# Patient Record
Sex: Female | Born: 1950 | Race: Black or African American | Hispanic: No | Marital: Married | Smoking: Former smoker
Health system: Southern US, Community
[De-identification: ages and names within clinical notes are randomized; demographics above are authoritative.]

## PROBLEM LIST (undated history)

## (undated) DIAGNOSIS — R000 Tachycardia, unspecified: Secondary | ICD-10-CM

## (undated) HISTORY — PX: TUBAL LIGATION: SHX77

## (undated) HISTORY — PX: TONSILLECTOMY: SUR1361

## (undated) HISTORY — PX: OTHER SURGICAL HISTORY: SHX169

## (undated) HISTORY — PX: ABDOMINAL HYSTERECTOMY: SHX81

---

## 2012-11-25 ENCOUNTER — Encounter: Payer: Self-pay | Admitting: *Deleted

## 2012-11-25 ENCOUNTER — Emergency Department
Admission: EM | Admit: 2012-11-25 | Discharge: 2012-11-25 | Disposition: A | Discharge status: Home / Self Care | Payer: BC Managed Care – PPO | Source: Home / Self Care | Attending: Family Medicine | Admitting: Family Medicine

## 2012-11-25 DIAGNOSIS — R109 Unspecified abdominal pain: Secondary | ICD-10-CM

## 2012-11-25 DIAGNOSIS — R300 Dysuria: Secondary | ICD-10-CM

## 2012-11-25 HISTORY — DX: Tachycardia, unspecified: R00.0

## 2012-11-25 LAB — POCT URINALYSIS DIP (MANUAL ENTRY)
Protein Ur, POC: NEGATIVE
Spec Grav, UA: >=1.03 (ref 1.005–1.03)
Urobilinogen, UA: 0.2 (ref 0–1)

## 2012-11-25 MED ORDER — NITROFURANTOIN MONOHYD MACRO 100 MG PO CAPS: 100.0000 mg | ORAL_CAPSULE | Freq: Two times a day (BID) | ORAL | Status: DC

## 2012-11-25 NOTE — ED Provider Notes (Signed)
CSN: [id number]     Arrival date & time 11/25/12  1928 History     First MD Initiated Contact with Patient 11/25/12 1950     Chief Complaint  Patient presents with  . bladder pain       HPI Comments: Patient reports a brief episode of frequency and dysuria two days ago that quickly resolved and has not recurred.  Associated with this she has had a vague sense of heaviness in her lower abdomen.  No nausea/vomiting.  No change in bowel movements.  No fevers, chills, and sweats.    She notes that she has been lifting her 48 year-old grandson over the past week.  She is s/p hysterectomy in 2005, and subsequent lysis of adhesions one year ago.  Patient is a 62 y.o. female presenting with dysuria. The history is provided by the patient.  Dysuria Pain quality:  Burning Pain severity:  Mild Onset quality:  Sudden Duration: brief. Progression:  Resolved Chronicity:  New Relieved by:  Nothing Worsened by:  Nothing tried Ineffective treatments:  None tried Urinary symptoms: no discolored urine, no foul-smelling urine, no frequent urination, no hematuria, no hesitancy and no bladder incontinence   Associated symptoms: abdominal pain   Associated symptoms: no fever, no flank pain, no genital lesions, no nausea, no vaginal discharge and no vomiting   Risk factors: no recurrent urinary tract infections     Past Medical History  Diagnosis Date  . Tachycardia    Past Surgical History  Procedure Laterality Date  . Abdominal hysterectomy    . Tonsillectomy    . Tubal ligation     Family History  Problem Relation Age of Onset  . Diabetes Mother   . Hypertension Mother   . Hyperlipidemia Mother   . Diabetes Father   . Hypertension Father   . Hyperlipidemia Father    History  Substance Use Topics  . Smoking status: Former Games developer  . Smokeless tobacco: Never Used  . Alcohol Use: Yes   OB History   Grav Para Term Preterm Abortions TAB SAB Ect Mult Living                 Review of  Systems  Constitutional: Negative for fever.  Gastrointestinal: Positive for abdominal pain. Negative for nausea and vomiting.  Genitourinary: Positive for dysuria. Negative for flank pain and vaginal discharge.  All other systems reviewed and are negative.    Allergies  Review of patient's allergies indicates no known allergies.  Home Medications   Current Outpatient Rx  Name  Route  Sig  Dispense  Refill  . estradiol (VIVELLE-DOT) 0.025 MG/24HR   Transdermal   Place 1 patch onto the skin 2 (two) times a week.         . metoprolol succinate (TOPROL-XL) 100 MG 24 hr tablet   Oral   Take 100 mg by mouth daily. Take with or immediately following a meal.         . pantoprazole (PROTONIX) 40 MG tablet   Oral   Take 40 mg by mouth daily.         . TRIAMTERENE PO   Oral   Take by mouth.         . nitrofurantoin, macrocrystal-monohydrate, (MACROBID) 100 MG capsule   Oral   Take 1 capsule (100 mg total) by mouth 2 (two) times daily. (Rx void after 11/24/13)   10 capsule   0    BP 127/83  Pulse 81  Temp(Src) 97.4  F (36.3 C) (Oral)  Resp 16  Ht 5' 1.5" (1.562 m)  Wt 159 lb 12.8 oz (72.485 kg)  BMI 29.71 kg/m2  SpO2 98% Physical Exam Nursing notes and Vital Signs reviewed. Appearance:  Patient appears healthy, stated age, and in no acute distress Eyes:  Pupils are equal, round, and reactive to light and accomodation.  Extraocular movement is intact.  Conjunctivae are not inflamed   Mouth/Pharynx:  Normal Neck:  Supple.  No adenopathy Lungs:  Clear to auscultation.  Breath sounds are equal.  Heart:  Regular rate and rhythm without murmurs, rubs, or gallops.  Abdomen:   Well-healed midline surgical scar beneath umbilicus, and transverse scar over suprapubic area without tenderness to palpation or evidence of hernia.  Abdomen nontender without masses or hepatosplenomegaly.  Bowel sounds are present.  No CVA or flank tenderness.  Extremities:  No edema.  No calf  tenderness Skin:  No rash present.    ED Course   Procedures  none  Labs Reviewed  URINE CULTURE pending  POCT URINALYSIS DIP (MANUAL ENTRY) Negative      1. Dysuria.       MDM  Note that patient had mild urinary symptoms for only one day, resolved.  Her exam is benign and her abdominal pain may simply be musculoskeletal from increased lifting.  She is at risk for recurrent bowel obstruction, having had lysis of adhesions one year ago. Urine culture pending.  Await culture results Followup with PCP if not improving. If symptoms become significantly worse during the night or over the weekend, proceed to the local emergency room.   Lattie Haw, MD 11/26/12 [id number]

## 2012-11-25 NOTE — ED Notes (Signed)
Christine Logan c/o bladder pain and dysuria x 2 days.

## 2012-11-27 ENCOUNTER — Telehealth: Payer: Self-pay | Admitting: *Deleted

## 2012-11-27 LAB — URINE CULTURE: Colony Count: 45000

## 2012-11-28 ENCOUNTER — Telehealth: Payer: Self-pay | Admitting: Emergency Medicine

## 2016-08-13 ENCOUNTER — Other Ambulatory Visit: Payer: Self-pay

## 2016-08-13 ENCOUNTER — Emergency Department (INDEPENDENT_AMBULATORY_CARE_PROVIDER_SITE_OTHER): Payer: BC Managed Care – PPO

## 2016-08-13 ENCOUNTER — Emergency Department
Admission: EM | Admit: 2016-08-13 | Discharge: 2016-08-13 | Disposition: A | Discharge status: Home / Self Care | Payer: BC Managed Care – PPO | Source: Home / Self Care | Attending: Family Medicine | Admitting: Family Medicine

## 2016-08-13 ENCOUNTER — Encounter: Payer: Self-pay | Admitting: Emergency Medicine

## 2016-08-13 DIAGNOSIS — J9811 Atelectasis: Secondary | ICD-10-CM | POA: Diagnosis not present

## 2016-08-13 DIAGNOSIS — I700 Atherosclerosis of aorta: Secondary | ICD-10-CM

## 2016-08-13 DIAGNOSIS — J90 Pleural effusion, not elsewhere classified: Secondary | ICD-10-CM

## 2016-08-13 DIAGNOSIS — J181 Lobar pneumonia, unspecified organism: Secondary | ICD-10-CM

## 2016-08-13 DIAGNOSIS — R0789 Other chest pain: Secondary | ICD-10-CM

## 2016-08-13 DIAGNOSIS — J189 Pneumonia, unspecified organism: Secondary | ICD-10-CM

## 2016-08-13 MED ORDER — CYCLOBENZAPRINE HCL 5 MG PO TABS: 5.0000 mg | ORAL_TABLET | Freq: Two times a day (BID) | ORAL | 0 refills | Status: DC | PRN

## 2016-08-13 MED ORDER — AZITHROMYCIN 250 MG PO TABS: 250.0000 mg | ORAL_TABLET | Freq: Every day | ORAL | 0 refills | Status: DC

## 2016-08-13 NOTE — Discharge Instructions (Signed)
°  Please take antibiotics as prescribed and be sure to complete entire course even if you start to feel better to ensure infection does not come back.  Flexeril is a muscle relaxer and may cause drowsiness. Do not drink alcohol, drive, or operate heavy machinery while taking.

## 2016-08-13 NOTE — ED Triage Notes (Signed)
Reports having pain under left breast off and on for 4 days; here visiting from out of town. History of heart arrhythmia and cardioversions.

## 2016-08-13 NOTE — ED Provider Notes (Signed)
CSN: [id number]     Arrival date & time 08/13/16  1856 History   First MD Initiated Contact with Patient 08/13/16 1916     Chief Complaint  Patient presents with  . Chest Pain    left    (Consider location/radiation/quality/duration/timing/severity/associated sxs/prior Treatment) HPI  Christine Logan is a 66 y.o. female presenting to UC with c/o pain under Left breast that has been constant but waxing and waning in severity for about 4 days.  Pt is visiting from out of town.  Hx of a heart arrhythmia and has needed cardioversions in the past but has not needed to see her cardiologist in a few years as she has been doing well.  Denies SOB but does have mild difficulty breathing when she feels a "catch" in her chest with certain movements.  Pt's husband thinks she pulled a muscle but pt is unsure as the pain seems to move to different places on Left side of chest.  She does note she has picked up her 37 year old grandson from time to time and may have caused the pain by lifting him.  Denies abdominal pain, n/v/d. Denies leg swelling or pain. Denies palpitations.  Eating does not seem to make symptoms better or worse.    Past Medical History:  Diagnosis Date  . Tachycardia    Past Surgical History:  Procedure Laterality Date  . ABDOMINAL HYSTERECTOMY    . cardioversions    . TONSILLECTOMY    . TUBAL LIGATION     Family History  Problem Relation Age of Onset  . Diabetes Mother   . Hypertension Mother   . Hyperlipidemia Mother   . Diabetes Father   . Hypertension Father   . Hyperlipidemia Father    Social History  Substance Use Topics  . Smoking status: Former Games developer  . Smokeless tobacco: Never Used  . Alcohol use Yes   OB History    No data available     Review of Systems  Constitutional: Negative for chills and fever.  HENT: Negative for congestion, ear pain, sore throat, trouble swallowing and voice change.   Respiratory: Positive for shortness of breath (intermittent when  pain "catches"). Negative for cough.   Cardiovascular: Positive for chest pain ( left side). Negative for palpitations.  Gastrointestinal: Negative for abdominal pain, diarrhea, nausea and vomiting.  Musculoskeletal: Negative for arthralgias, back pain and myalgias.  Skin: Negative for rash.    Allergies  Patient has no known allergies.  Home Medications   Prior to Admission medications   Medication Sig Start Date End Date Taking? Authorizing Provider  aspirin 81 MG chewable tablet Chew by mouth daily.   Yes Historical Provider, MD  azithromycin (ZITHROMAX) 250 MG tablet Take 1 tablet (250 mg total) by mouth daily. Take first 2 tablets together, then 1 every day until finished. 08/13/16   Junius Finner, PA-C  cyclobenzaprine (FLEXERIL) 5 MG tablet Take 1-2 tablets (5-10 mg total) by mouth 2 (two) times daily as needed for muscle spasms. 08/13/16   Junius Finner, PA-C  estradiol (VIVELLE-DOT) 0.025 MG/24HR Place 1 patch onto the skin 2 (two) times a week.    Historical Provider, MD  metoprolol succinate (TOPROL-XL) 100 MG 24 hr tablet Take 100 mg by mouth daily. Take with or immediately following a meal.    Historical Provider, MD  nitrofurantoin, macrocrystal-monohydrate, (MACROBID) 100 MG capsule Take 1 capsule (100 mg total) by mouth 2 (two) times daily. (Rx void after 11/24/13) 11/25/12   Tera Mater  Beese, MD  pantoprazole (PROTONIX) 40 MG tablet Take 40 mg by mouth daily.    Historical Provider, MD  TRIAMTERENE PO Take by mouth.    Historical Provider, MD   Meds Ordered and Administered this Visit  Medications - No data to display  BP (!) 142/80 (BP Location: Left Arm)   Pulse 82   Temp 97.5 F (36.4 C) (Oral)   Resp 16   Ht  (1.575 m)   Wt 156 lb (70.8 kg)   SpO2 100%   BMI 28.53 kg/m  No data found.   Physical Exam  Constitutional: She is oriented to person, place, and time. She appears well-developed and well-nourished. No distress.  Pt sitting on exam chair, NAD.    HENT:  Head: Normocephalic and atraumatic.  Eyes: EOM are normal.  Neck: Normal range of motion. Neck supple.  Cardiovascular: Normal rate and regular rhythm.   Pulmonary/Chest: Effort normal and breath sounds normal. No respiratory distress. She has no wheezes. She has no rales. She exhibits tenderness ( minimal under left breast).  Abdominal: She exhibits no distension and no mass. There is no tenderness. There is no rebound and no guarding.  Musculoskeletal: Normal range of motion.  Neurological: She is alert and oriented to person, place, and time.  Skin: Skin is warm and dry. She is not diaphoretic.  Psychiatric: She has a normal mood and affect. Her behavior is normal.  Nursing note and vitals reviewed.   Urgent Care Course     Procedures (including critical care time)  Labs Review Labs Reviewed - No data to display  Imaging Review Dg Chest 2 View  Result Date: 08/13/2016 CLINICAL DATA:  Left-sided chest pain beneath the left breast. Pain with inspiration. EXAM: CHEST  2 VIEW COMPARISON:  None. FINDINGS: The heart size and mediastinal contours are within normal limits. Aortic atherosclerosis is identified at the arch. Subtle lingular opacities are noted with atelectasis at the left lung base. Trace left pleural effusion. The visualized skeletal structures are unremarkable. IMPRESSION: Findings suggest a subtle lingular pneumonia with trace left pleural effusion. Aortic atherosclerosis. Electronically Signed   By: Tollie Eth M.D.   On: 08/13/2016 19:57    Date/Time:08/13/16    19:20:45  Ventricular Rate: 83 PR Interval: 130 QRS Duration: 76 QT Interval: 368 QTC Calculation: 432 P-R-T axes: 70  32  38 Text Interpretation: Normal sinus rhythm, possible Left artrial enlargement. Borderline ECG No prior to compare.     MDM   1. Left-sided chest wall pain   2. Community acquired pneumonia of left lower lobe of lung (HCC)     Pt c/o 4 days of Left side chest  pain that is occasionally sharp in nature, worse with certain movements.  Minimal tenderness under Left breast. No abdominal tenderness or GI symptoms.   Vitals: unremarkable including O2 Sat of 100%  EKG: unremarkable   CXR: subtle Left lingular pneumonia   Pain likely due to muscle strain in chest wall vs pleurisy and early pneumonia. Rx: Azithromycin and Flexeril Encouraged alternating cool and warm compresses for comfort.  Pt from out of town as this is her 2nd residence. She does not have a PCP here.  May return to UC if needed.   Discussed symptoms that warrant emergent care in the ED.     Junius Finner, PA-C 08/13/16 2009

## 2016-08-13 NOTE — ED Triage Notes (Signed)
Summoned Junius Finner to triage room after getting patient history.

## 2019-02-04 IMAGING — DX DG CHEST 2V
2 series · 2 of 2 positions shown · non-contrast
Comparison: None.

CLINICAL DATA: Left-sided chest pain beneath the left breast. Pain
with inspiration.

EXAM:
CHEST  2 VIEW

[chest pa]
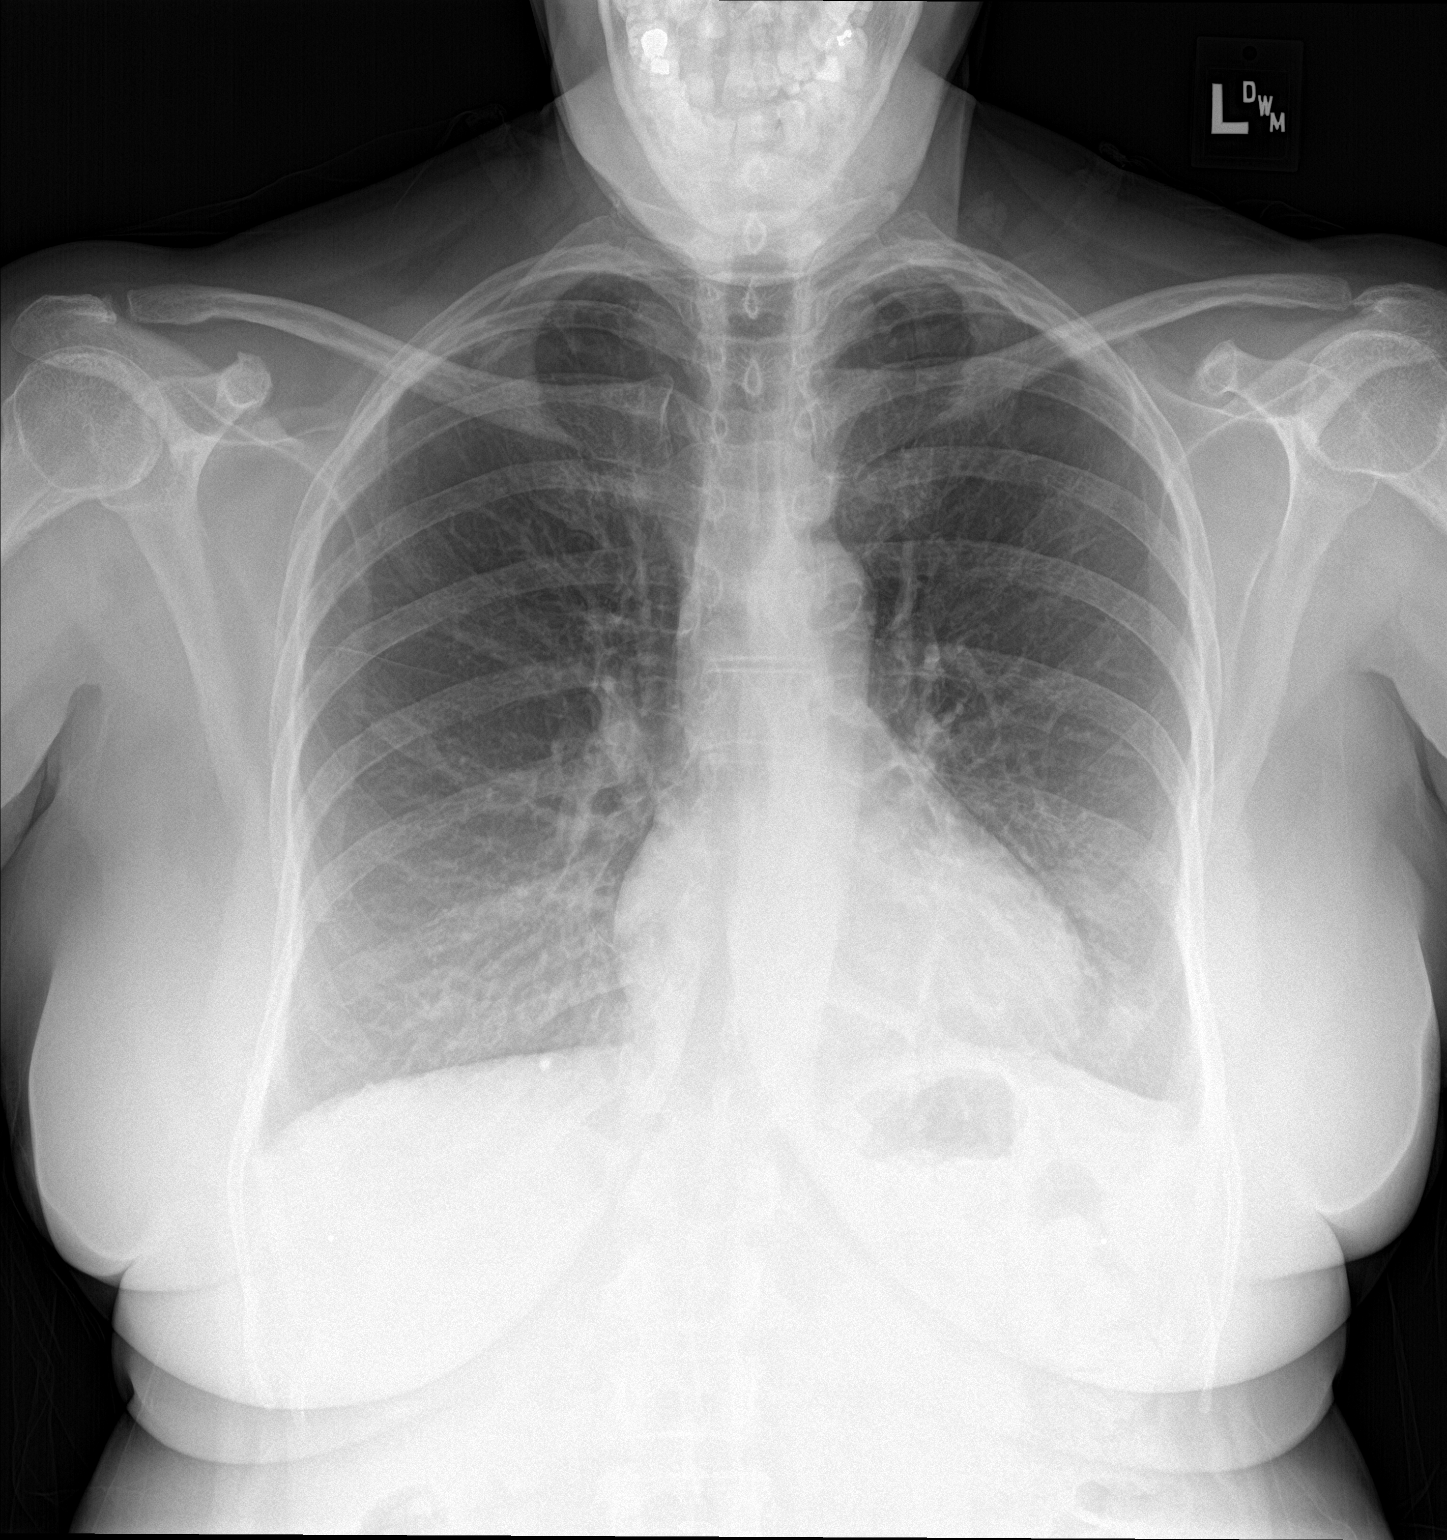

[chest lat]
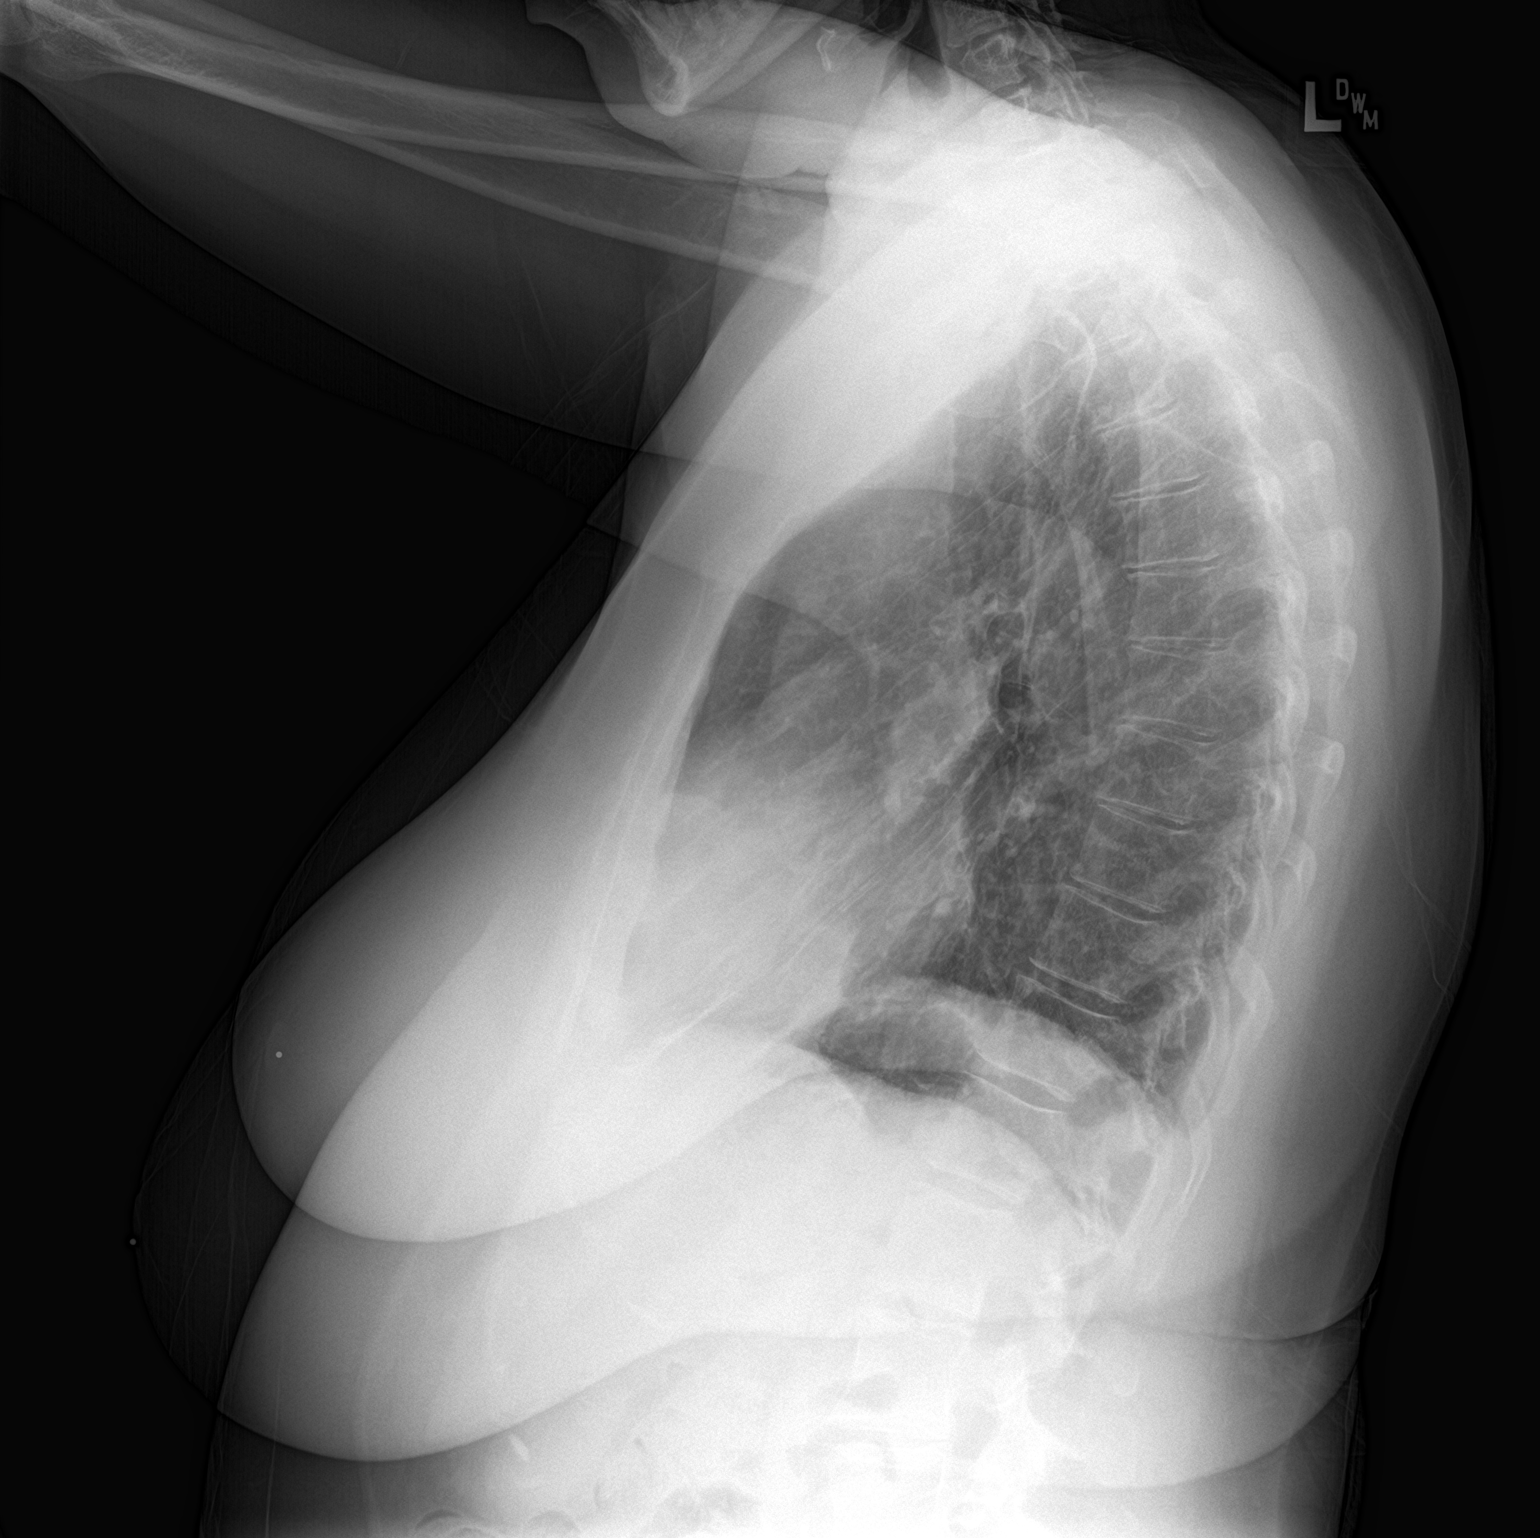

[2 of 2 positions shown; findings below may reference images not displayed]

FINDINGS: The heart size and mediastinal contours are within normal limits.
Aortic atherosclerosis is identified at the arch. Subtle lingular
opacities are noted with atelectasis at the left lung base. Trace
left pleural effusion. The visualized skeletal structures are
unremarkable.
IMPRESSION: Findings suggest a subtle lingular pneumonia with trace left pleural
effusion. Aortic atherosclerosis.

## 2020-05-28 ENCOUNTER — Other Ambulatory Visit: Payer: Self-pay

## 2020-05-28 ENCOUNTER — Emergency Department (INDEPENDENT_AMBULATORY_CARE_PROVIDER_SITE_OTHER)
Admission: EM | Admit: 2020-05-28 | Discharge: 2020-05-28 | Disposition: A | Discharge status: Home / Self Care | Payer: Medicare PPO | Source: Home / Self Care

## 2020-05-28 DIAGNOSIS — R519 Headache, unspecified: Secondary | ICD-10-CM

## 2020-05-28 DIAGNOSIS — J069 Acute upper respiratory infection, unspecified: Secondary | ICD-10-CM

## 2020-05-28 DIAGNOSIS — Z20822 Contact with and (suspected) exposure to covid-19: Secondary | ICD-10-CM | POA: Diagnosis not present

## 2020-05-28 MED ORDER — BENZONATATE 100 MG PO CAPS: 100.0000 mg | ORAL_CAPSULE | Freq: Three times a day (TID) | ORAL | 0 refills | Status: DC

## 2020-05-28 MED ORDER — FLUTICASONE PROPIONATE 50 MCG/ACT NA SUSP: 2.0000 | Freq: Every day | NASAL | 0 refills | Status: AC

## 2020-05-28 NOTE — ED Provider Notes (Signed)
[facility]   [id number] 05/28/20 Arrival Time: 1317   CC: COVID symptoms  SUBJECTIVE: History from: patient.  Christine Logan is a 70 y.o. female who presents with cough, headache x 5 days. Reports that her grandson has been sick. Denies sick exposure to COVID, flu or strep. Denies recent travel. Has negative history of Covid. Has completed Covid vaccines. Has not taken OTC medications for this. There are no aggravating or alleviating factors. Denies fever, chills, fatigue, sinus pain, SOB, wheezing, chest pain, nausea, changes in bowel or bladder habits.    ROS: As per HPI.  All other pertinent ROS negative.     Past Medical History:  Diagnosis Date  . Tachycardia    Past Surgical History:  Procedure Laterality Date  . ABDOMINAL HYSTERECTOMY    . cardioversions    . TONSILLECTOMY    . TUBAL LIGATION     No Known Allergies No current facility-administered medications on file prior to encounter.   Current Outpatient Medications on File Prior to Encounter  Medication Sig Dispense Refill  . atorvastatin (LIPITOR) 20 MG tablet Take 20 mg by mouth daily.    . metoprolol succinate (TOPROL-XL) 100 MG 24 hr tablet Take 100 mg by mouth daily. Take with or immediately following a meal.    . venlafaxine XR (EFFEXOR-XR) 37.5 MG 24 hr capsule Take 37.5 mg by mouth daily with breakfast.    . aspirin 81 MG chewable tablet Chew by mouth daily.    Marland Kitchen azithromycin (ZITHROMAX) 250 MG tablet Take 1 tablet (250 mg total) by mouth daily. Take first 2 tablets together, then 1 every day until finished. 6 tablet 0  . cyclobenzaprine (FLEXERIL) 5 MG tablet Take 1-2 tablets (5-10 mg total) by mouth 2 (two) times daily as needed for muscle spasms. 20 tablet 0  . estradiol (VIVELLE-DOT) 0.025 MG/24HR Place 1 patch onto the skin 2 (two) times a week.    . nitrofurantoin, macrocrystal-monohydrate, (MACROBID) 100 MG capsule Take 1 capsule (100 mg total) by mouth 2 (two) times daily. (Rx void after  11/24/13) 10 capsule 0  . pantoprazole (PROTONIX) 40 MG tablet Take 40 mg by mouth daily.    . TRIAMTERENE PO Take by mouth.     Social History   Socioeconomic History  . Marital status: Married    Spouse name: Not on file  . Number of children: Not on file  . Years of education: Not on file  . Highest education level: Not on file  Occupational History  . Not on file  Tobacco Use  . Smoking status: Former Games developer  . Smokeless tobacco: Never Used  Substance and Sexual Activity  . Alcohol use: Yes    Comment: occ  . Drug use: No  . Sexual activity: Not on file  Other Topics Concern  . Not on file  Social History Narrative  . Not on file   Social Determinants of Health   Financial Resource Strain: Not on file  Food Insecurity: Not on file  Transportation Needs: Not on file  Physical Activity: Not on file  Stress: Not on file  Social Connections: Not on file  Intimate Partner Violence: Not on file   Family History  Problem Relation Age of Onset  . Diabetes Mother   . Hypertension Mother   . Hyperlipidemia Mother   . Diabetes Father   . Hypertension Father   . Hyperlipidemia Father   . Cancer Father     OBJECTIVE:  Vitals:   05/28/20 1334  BP: (!) 180/86  Pulse: 73  Resp: 16  Temp: 98.6 F (37 C)  TempSrc: Oral  SpO2: 97%     General appearance: alert; appears fatigued, but nontoxic; speaking in full sentences and tolerating own secretions HEENT: NCAT; Ears: EACs clear, TMs pearly gray; Eyes: PERRL.  EOM grossly intact. Sinuses: nontender; Nose: nares patent with clear rhinorrhea, Throat: oropharynx erythematous, cobblestoning present, tonsils non erythematous or enlarged, uvula midline  Neck: supple without LAD Lungs: unlabored respirations, symmetrical air entry; cough: absent; no respiratory distress; CTAB Heart: regular rate and rhythm.  Radial pulses 2+ symmetrical bilaterally Skin: warm and dry Psychological: alert and cooperative; normal mood and  affect  LABS:  No results found for this or any previous visit (from the past 24 hour(s)).   ASSESSMENT & PLAN:  1. Viral URI with cough   2. Encounter for laboratory testing for COVID-19 virus   3. Nonintractable headache, unspecified chronicity pattern, unspecified headache type     Meds ordered this encounter  Medications  . fluticasone (FLONASE) 50 MCG/ACT nasal spray    Sig: Place 2 sprays into both nostrils daily.    Dispense:  9.9 mL    Refill:  0    Order Specific Question:   Supervising Provider    Answer:   Merrilee Jansky [id number]  . benzonatate (TESSALON) 100 MG capsule    Sig: Take 1 capsule (100 mg total) by mouth every 8 (eight) hours.    Dispense:  21 capsule    Refill:  0    Order Specific Question:   Supervising Provider    Answer:   Merrilee Jansky [[id number]]   Prescribed flonase Prescribed benzonatate Continue supportive care at home COVID and flu testing ordered.  It will take between 2-3 days for test results. Someone will contact you regarding abnormal results.   Patient should remain in quarantine until they have received Covid results.  If negative you may resume normal activities (go back to work/school) while practicing hand hygiene, social distance, and mask wearing.  If positive, patient should remain in quarantine for at least 5 days from symptom onset AND greater than 72 hours after symptoms resolution (absence of fever without the use of fever-reducing medication and improvement in respiratory symptoms), whichever is longer Get plenty of rest and push fluids Use OTC zyrtec for nasal congestion, runny nose, and/or sore throat Use OTC flonase for nasal congestion and runny nose Use medications daily for symptom relief Use OTC medications like ibuprofen or tylenol as needed fever or pain Call or go to the ED if you have any new or worsening symptoms such as fever, worsening cough, shortness of breath, chest tightness, chest pain, turning blue,  changes in mental status.  Reviewed expectations re: course of current medical issues. Questions answered. Outlined signs and symptoms indicating need for more acute intervention. Patient verbalized understanding. After Visit Summary given.         Moshe Cipro, NP 05/28/20 1347

## 2020-05-28 NOTE — ED Triage Notes (Signed)
Patient presents to Urgent Care with complaints of cough, headache since about 5 days ago. Patient reports she thinks she might have a sinus infection, may have gotten it from her grandsom.

## 2020-05-28 NOTE — Discharge Instructions (Addendum)
I have sent in tessalon perles for you to use one capsule every 8 hours as needed for cough.  I have sent in flonase for you to use 2 sprays per nostril daily as needed for nasal congestion.  Your COVID test is pending.  You should self quarantine until the test result is back.    Take Tylenol or ibuprofen as needed for fever or discomfort.  Rest and keep yourself hydrated.    Follow-up with your primary care provider if your symptoms are not improving.

## 2020-05-30 LAB — SARS-COV-2 RNA,(COVID-19) QUALITATIVE NAAT: SARS CoV2 RNA: NOT DETECTED

## 2020-11-09 ENCOUNTER — Emergency Department
Admission: EM | Admit: 2020-11-09 | Discharge: 2020-11-09 | Disposition: A | Discharge status: Home / Self Care | Payer: Medicare PPO | Source: Home / Self Care

## 2020-11-09 ENCOUNTER — Encounter: Payer: Self-pay | Admitting: Emergency Medicine

## 2020-11-09 ENCOUNTER — Other Ambulatory Visit: Payer: Self-pay

## 2020-11-09 DIAGNOSIS — R300 Dysuria: Secondary | ICD-10-CM

## 2020-11-09 DIAGNOSIS — N3090 Cystitis, unspecified without hematuria: Secondary | ICD-10-CM | POA: Diagnosis not present

## 2020-11-09 LAB — POCT URINALYSIS DIP (MANUAL ENTRY)
Bilirubin, UA: NEGATIVE
Glucose, UA: NEGATIVE mg/dL
Ketones, POC UA: NEGATIVE mg/dL
Nitrite, UA: NEGATIVE
Protein Ur, POC: 30 mg/dL — AB
Spec Grav, UA: >=1.03 — AB (ref 1.010–1.025)
Urobilinogen, UA: 1 E.U./dL
pH, UA: 6 (ref 5.0–8.0)

## 2020-11-09 MED ORDER — CEPHALEXIN 500 MG PO CAPS: 500.0000 mg | ORAL_CAPSULE | Freq: Four times a day (QID) | ORAL | 0 refills | Status: AC

## 2020-11-09 NOTE — ED Triage Notes (Signed)
Urgency w/ bladder pressure since Thursday night  No OTC meds Denies hx of kidney stones

## 2020-11-09 NOTE — Discharge Instructions (Addendum)
Take Keflex 4 times daily for the next seven days.

## 2020-11-09 NOTE — ED Provider Notes (Signed)
[facility]  ____________________________________________  Time seen: Approximately 3:14 PM  I have reviewed the triage vital signs and the nursing notes.   HISTORY  Chief Complaint Dysuria   Historian Patient     HPI Christine Logan is a 70 y.o. female presents to the urgent care with dysuria, hematuria and increased urinary frequency as well as suprapubic pain for the past 2 days.  Patient denies flank pain, nausea, vomiting or other abdominal discomfort.  Denies a history of pyelonephritis or nephrolithiasis.  States that its been at least 15 years since she had a UTI.  No new sexual partners.   Past Medical History:  Diagnosis Date   Tachycardia      Immunizations up to date:  Yes.     Past Medical History:  Diagnosis Date   Tachycardia     There are no problems to display for this patient.   Past Surgical History:  Procedure Laterality Date   ABDOMINAL HYSTERECTOMY     cardioversions     TONSILLECTOMY     TUBAL LIGATION      Prior to Admission medications   Medication Sig Start Date End Date Taking? Authorizing Provider  cephALEXin (KEFLEX) 500 MG capsule Take 1 capsule (500 mg total) by mouth 4 (four) times daily for 7 days. 11/09/20 11/16/20 Yes Pia Mau M, PA-C  aspirin 81 MG chewable tablet Chew by mouth daily.    [provider]  atorvastatin (LIPITOR) 20 MG tablet Take 20 mg by mouth daily.    [provider]  azithromycin (ZITHROMAX) 250 MG tablet Take 1 tablet (250 mg total) by mouth daily. Take first 2 tablets together, then 1 every day until finished. Patient not taking: Reported on 11/09/2020 08/13/16   Lurene Shadow, PA-C  benzonatate (TESSALON) 100 MG capsule Take 1 capsule (100 mg total) by mouth every 8 (eight) hours. Patient not taking: Reported on 11/09/2020 05/28/20   Moshe Cipro, NP  cyclobenzaprine (FLEXERIL) 5 MG tablet Take 1-2 tablets (5-10 mg total) by mouth 2 (two) times daily as needed for  muscle spasms. Patient not taking: Reported on 11/09/2020 08/13/16   Lurene Shadow, PA-C  estradiol (VIVELLE-DOT) 0.025 MG/24HR Place 1 patch onto the skin 2 (two) times a week. Patient not taking: Reported on 11/09/2020    [provider]  famotidine (PEPCID) 20 MG tablet Take 20 mg by mouth 2 (two) times daily. 10/09/20   [provider]  fluticasone (FLONASE) 50 MCG/ACT nasal spray Place 2 sprays into both nostrils daily. 05/28/20   Moshe Cipro, NP  metoprolol succinate (TOPROL-XL) 100 MG 24 hr tablet Take 100 mg by mouth daily. Take with or immediately following a meal.    [provider]  nitrofurantoin, macrocrystal-monohydrate, (MACROBID) 100 MG capsule Take 1 capsule (100 mg total) by mouth 2 (two) times daily. (Rx void after 11/24/13) Patient not taking: Reported on 11/09/2020 11/25/12   Lattie Haw, MD  pantoprazole (PROTONIX) 40 MG tablet Take 40 mg by mouth daily.    [provider]  TRIAMTERENE PO Take by mouth. Patient not taking: Reported on 11/09/2020    [provider]  venlafaxine XR (EFFEXOR-XR) 37.5 MG 24 hr capsule Take 37.5 mg by mouth daily with breakfast.    [provider]    Allergies Patient has no known allergies.  Family History  Problem Relation Age of Onset   Diabetes Mother    Hypertension Mother    Hyperlipidemia Mother    Diabetes Father  Hypertension Father    Hyperlipidemia Father    Cancer Father     Social History Social History   Tobacco Use   Smoking status: Former   Smokeless tobacco: Never  Substance Use Topics   Alcohol use: Yes    Comment: occ   Drug use: No     Review of Systems  Constitutional: No fever/chills Eyes:  No discharge ENT: No upper respiratory complaints. Respiratory: no cough. No SOB/ use of accessory muscles to breath Gastrointestinal:   No nausea, no vomiting.  No diarrhea.  No constipation. Genitourinary: Patient has dysuria and increased urinary  frequency.  Musculoskeletal: Negative for musculoskeletal pain. Skin: Negative for rash, abrasions, lacerations, ecchymosis.    ____________________________________________   PHYSICAL EXAM:  VITAL SIGNS: ED Triage Vitals  Enc Vitals Group     BP 11/09/20 1500 122/79     Pulse Rate 11/09/20 1500 63     Resp 11/09/20 1500 17     Temp 11/09/20 1500 98.5 F (36.9 C)     Temp Source 11/09/20 1500 Oral     SpO2 11/09/20 1500 97 %     Weight --      Height --      Head Circumference --      Peak Flow --      Pain Score 11/09/20 1503 3     Pain Loc --      Pain Edu? --      Excl. in GC? --      Constitutional: Alert and oriented. Well appearing and in no acute distress. Eyes: Conjunctivae are normal. PERRL. EOMI. Head: Atraumatic. ENT:  Cardiovascular: Normal rate, regular rhythm. Normal S1 and S2.  Good peripheral circulation. Respiratory: Normal respiratory effort without tachypnea or retractions. Lungs CTAB. Good air entry to the bases with no decreased or absent breath sounds Gastrointestinal: Bowel sounds x 4 quadrants. Soft and nontender to palpation. No guarding or rigidity. No distention. No CVA tenderness.  Musculoskeletal: Full range of motion to all extremities. No obvious deformities noted Neurologic:  Normal for age. No gross focal neurologic deficits are appreciated.  Skin:  Skin is warm, dry and intact. No rash noted. Psychiatric: Mood and affect are normal for age. Speech and behavior are normal.   ____________________________________________   LABS (all labs ordered are listed, but only abnormal results are displayed)  Labs Reviewed  POCT URINALYSIS DIP (MANUAL ENTRY) - Abnormal; Notable for the following components:      Result Value   Clarity, UA turbid (*)    Spec Grav, UA >=1.030 (*)    Blood, UA large (*)    Protein Ur, POC =30 (*)    Leukocytes, UA Small (1+) (*)    All other components within normal limits  URINE CULTURE    ____________________________________________  EKG   ____________________________________________  RADIOLOGY   No results found.  ____________________________________________    PROCEDURES  Procedure(s) performed:     Procedures     Medications - No data to display   ____________________________________________   INITIAL IMPRESSION / ASSESSMENT AND PLAN / ED COURSE  Pertinent labs & imaging results that were available during my care of the patient were reviewed by me and considered in my medical decision making (see chart for details).    Assessment and plan Cystitis 70 year old female presents to the urgent care with dysuria, suprapubic pain and hematuria for the past 2 to 3 days.  Vital signs are reassuring at triage.  Patient did have some suprapubic tenderness  to palpation but no flank pain or other abdominal pain.  Urinalysis shows large amount of RBCs and a small amount of leukocytes.  We will treat with Keflex 4 times daily for the next 7 days.  Patient was advised to seek care at a local emergency department should she experience flank pain, nausea, vomiting or fever.        ____________________________________________  FINAL CLINICAL IMPRESSION(S) / ED DIAGNOSES  Final diagnoses:  Cystitis  Dysuria      NEW MEDICATIONS STARTED DURING THIS VISIT:  ED Discharge Orders          Ordered    cephALEXin (KEFLEX) 500 MG capsule  4 times daily        11/09/20 1512                This chart was dictated using voice recognition software/Dragon. Despite best efforts to proofread, errors can occur which can change the meaning. Any change was purely unintentional.     Orvil Feil, PA-C 11/09/20 1520

## 2020-11-11 LAB — URINE CULTURE
MICRO NUMBER:: 12156524
SPECIMEN QUALITY:: ADEQUATE

## 2021-06-25 ENCOUNTER — Other Ambulatory Visit: Payer: Self-pay

## 2021-06-25 ENCOUNTER — Emergency Department
Admission: EM | Admit: 2021-06-25 | Discharge: 2021-06-25 | Disposition: A | Discharge status: Home / Self Care | Payer: Medicare PPO | Source: Home / Self Care | Attending: Family Medicine | Admitting: Family Medicine

## 2021-06-25 ENCOUNTER — Emergency Department (INDEPENDENT_AMBULATORY_CARE_PROVIDER_SITE_OTHER): Payer: Medicare PPO

## 2021-06-25 DIAGNOSIS — J209 Acute bronchitis, unspecified: Secondary | ICD-10-CM | POA: Diagnosis not present

## 2021-06-25 DIAGNOSIS — R059 Cough, unspecified: Secondary | ICD-10-CM | POA: Diagnosis not present

## 2021-06-25 MED ORDER — BENZONATATE 200 MG PO CAPS: 200.0000 mg | ORAL_CAPSULE | Freq: Two times a day (BID) | ORAL | 0 refills | Status: DC | PRN

## 2021-06-25 MED ORDER — AZITHROMYCIN 250 MG PO TABS: ORAL_TABLET | ORAL | 0 refills | Status: DC

## 2021-06-25 NOTE — Discharge Instructions (Signed)
Chest x-ray is normal ?I am treating you for bronchitis ?Take the Z-Pak antibiotic as directed.  2 pills today then 1 a day until gone ?Take Tessalon as needed for cough ?Drink lots of fluids ?Run a humidifier if you have 1 ?See your doctor if not improving by next week ?

## 2021-06-25 NOTE — ED Triage Notes (Signed)
Pt c/o cough x 3 weeks. Worsening in the last week. Worse at night. Husband and grandson also sick with cough. Hx of pneumonia.  ?

## 2021-06-25 NOTE — ED Provider Notes (Signed)
?[facility] ? ? ? ?CSN: [id number] ?Arrival date & time: 06/25/21  1626 ? ? ?  ? ?History   ?Chief Complaint ?Chief Complaint  ?Patient presents with  ? Cough  ? ? ?HPI ?Christine Logan is a 71 y.o. female.  ? ?HPI ? ?Patient states she has a cough.  Some sputum production.  Some postnasal drip but not much.  No sore throat or headache.  She states this all started as a respiratory infection close to Valentine's Day.  She states she had body aches and shaking chills initially, these went away, but the cough is persisted.  She has concerns because she does have a history of pneumonia in the past.  No underlying lung disease or asthma.  Currently has cough, worse at night, some congestion and sputum.  No chest pain.  No shortness of breath.  Mild fatigue ? ?Past Medical History:  ?Diagnosis Date  ? Tachycardia   ? ? ?There are no problems to display for this patient. ? ? ?Past Surgical History:  ?Procedure Laterality Date  ? ABDOMINAL HYSTERECTOMY    ? cardioversions    ? TONSILLECTOMY    ? TUBAL LIGATION    ? ? ?OB History   ?No obstetric history on file. ?  ? ? ? ?Home Medications   ? ?Prior to Admission medications   ?Medication Sig Start Date End Date Taking? Authorizing Provider  ?azithromycin (ZITHROMAX Z-PAK) 250 MG tablet Take two pills today followed by one a day until gone 06/25/21  Yes Eustace MooreNelson, Lakenya Riendeau Sue, MD  ?benzonatate (TESSALON) 200 MG capsule Take 1 capsule (200 mg total) by mouth 2 (two) times daily as needed for cough. 06/25/21  Yes Eustace MooreNelson, Raziah Funnell Sue, MD  ?aspirin 81 MG chewable tablet Chew by mouth daily.    [provider]  ?atorvastatin (LIPITOR) 20 MG tablet Take 20 mg by mouth daily.    [provider]  ?famotidine (PEPCID) 20 MG tablet Take 20 mg by mouth 2 (two) times daily. 10/09/20   [provider]  ?fluticasone (FLONASE) 50 MCG/ACT nasal spray Place 2 sprays into both nostrils daily. 05/28/20   Moshe CiproMatthews, Stephanie, NP  ?metoprolol succinate (TOPROL-XL) 100 MG  24 hr tablet Take 100 mg by mouth daily. Take with or immediately following a meal.    [provider]  ?pantoprazole (PROTONIX) 40 MG tablet Take 40 mg by mouth daily.    [provider]  ?venlafaxine XR (EFFEXOR-XR) 37.5 MG 24 hr capsule Take 37.5 mg by mouth daily with breakfast.    [provider]  ? ? ?Family History ?Family History  ?Problem Relation Age of Onset  ? Diabetes Mother   ? Hypertension Mother   ? Hyperlipidemia Mother   ? Diabetes Father   ? Hypertension Father   ? Hyperlipidemia Father   ? Cancer Father   ? ? ?Social History ?Social History  ? ?Tobacco Use  ? Smoking status: Former  ? Smokeless tobacco: Never  ?Substance Use Topics  ? Alcohol use: Yes  ?  Comment: occ  ? Drug use: No  ? ? ? ?Allergies   ?Patient has no known allergies. ? ? ?Review of Systems ?Review of Systems ?See HPI ? ?Physical Exam ?Triage Vital Signs ?ED Triage Vitals  ?Enc Vitals Group  ?   BP 06/25/21 1636 (!) 180/78  ?   Pulse Rate 06/25/21 1636 77  ?   Resp 06/25/21 1636 17  ?   Temp 06/25/21 1641 97.9 ?F (36.6 ?C)  ?  Temp Source 06/25/21 1636 Oral  ?   SpO2 06/25/21 1636 99 %  ?   Weight --   ?   Height --   ?   Head Circumference --   ?   Peak Flow --   ?   Pain Score 06/25/21 1639 0  ?   Pain Loc --   ?   Pain Edu? --   ?   Excl. in GC? --   ? ?No data found. ? ?Updated Vital Signs ?BP 138/81 (BP Location: Right Arm)   Pulse 77   Temp 97.9 ?F (36.6 ?C)   Resp 17   SpO2 99%  ?  ? ?Physical Exam ?Constitutional:   ?   General: She is not in acute distress. ?   Appearance: She is well-developed.  ?HENT:  ?   Head: Normocephalic and atraumatic.  ?   Right Ear: Tympanic membrane and ear canal normal.  ?   Left Ear: Tympanic membrane and ear canal normal.  ?   Nose: Nose normal. No congestion.  ?   Mouth/Throat:  ?   Mouth: Mucous membranes are moist.  ?   Pharynx: No posterior oropharyngeal erythema.  ?Eyes:  ?   Conjunctiva/sclera: Conjunctivae normal.  ?   Pupils: Pupils are equal, round,  and reactive to light.  ?Cardiovascular:  ?   Rate and Rhythm: Normal rate and regular rhythm.  ?   Heart sounds: Normal heart sounds.  ?Pulmonary:  ?   Effort: Pulmonary effort is normal. No respiratory distress.  ?   Breath sounds: Rhonchi present.  ?   Comments: Scattered anterior rhonchi.  No wheeze or rales ?Abdominal:  ?   General: There is no distension.  ?   Palpations: Abdomen is soft.  ?Musculoskeletal:     ?   General: Normal range of motion.  ?   Cervical back: Normal range of motion.  ?Lymphadenopathy:  ?   Cervical: No cervical adenopathy.  ?Skin: ?   General: Skin is warm and dry.  ?Neurological:  ?   Mental Status: She is alert.  ?Psychiatric:     ?   Mood and Affect: Mood normal.     ?   Behavior: Behavior normal.  ? ? ? ?UC Treatments / Results  ?Labs ?(all labs ordered are listed, but only abnormal results are displayed) ?Labs Reviewed - No data to display ? ?EKG ? ? ?Radiology ?DG Chest 2 View ? ?Result Date: 06/25/2021 ?CLINICAL DATA:  Cough for 3 weeks. EXAM: CHEST - 2 VIEW COMPARISON:  Chest x-ray 08/13/2016. FINDINGS: The heart size and mediastinal contours are within normal limits. Both lungs are clear. The visualized skeletal structures are unremarkable. IMPRESSION: No active cardiopulmonary disease. Electronically Signed   By: Darliss Cheney M.D.   On: 06/25/2021 17:33   ? ?Procedures ?Procedures (including critical care time) ? ?Medications Ordered in UC ?Medications - No data to display ? ?Initial Impression / Assessment and Plan / UC Course  ?I have reviewed the triage vital signs and the nursing notes. ? ?Pertinent labs & imaging results that were available during my care of the patient were reviewed by me and considered in my medical decision making (see chart for details). ? ?  ? ?Patient has worsening symptoms in spite of conservative care for 3 weeks.  Will cover with antibiotics.  Cough management.  Reviewed home care.  Return as needed ?Final Clinical Impressions(s) / UC Diagnoses   ? ?Final diagnoses:  ?Acute bronchitis, unspecified  organism  ? ? ? ?Discharge Instructions   ? ?  ?Chest x-ray is normal ?I am treating you for bronchitis ?Take the Z-Pak antibiotic as directed.  2 pills today then 1 a day until gone ?Take Tessalon as needed for cough ?Drink lots of fluids ?Run a humidifier if you have 1 ?See your doctor if not improving by next week ? ? ? ? ?ED Prescriptions   ? ? Medication Sig Dispense Auth. Provider  ? benzonatate (TESSALON) 200 MG capsule Take 1 capsule (200 mg total) by mouth 2 (two) times daily as needed for cough. 20 capsule Eustace Moore, MD  ? azithromycin (ZITHROMAX Z-PAK) 250 MG tablet Take two pills today followed by one a day until gone 6 tablet Delton See Letta Pate, MD  ? ?  ? ?PDMP not reviewed this encounter. ?  ?Eustace Moore, MD ?06/25/21 1747 ? ?

## 2021-12-27 ENCOUNTER — Ambulatory Visit
Admission: EM | Admit: 2021-12-27 | Discharge: 2021-12-27 | Disposition: A | Discharge status: Home / Self Care | Payer: Medicare PPO | Attending: Family Medicine | Admitting: Family Medicine

## 2021-12-27 ENCOUNTER — Other Ambulatory Visit: Payer: Self-pay

## 2021-12-27 DIAGNOSIS — R300 Dysuria: Secondary | ICD-10-CM | POA: Diagnosis not present

## 2021-12-27 DIAGNOSIS — R350 Frequency of micturition: Secondary | ICD-10-CM | POA: Diagnosis present

## 2021-12-27 LAB — POCT URINALYSIS DIP (MANUAL ENTRY)
Bilirubin, UA: NEGATIVE
Blood, UA: NEGATIVE
Glucose, UA: NEGATIVE mg/dL
Ketones, POC UA: NEGATIVE mg/dL
Nitrite, UA: NEGATIVE
Protein Ur, POC: NEGATIVE mg/dL
Spec Grav, UA: 1.02 (ref 1.010–1.025)
Urobilinogen, UA: 0.2 E.U./dL
pH, UA: 7 (ref 5.0–8.0)

## 2021-12-27 MED ORDER — SULFAMETHOXAZOLE-TRIMETHOPRIM 800-160 MG PO TABS: 1.0000 | ORAL_TABLET | Freq: Two times a day (BID) | ORAL | 0 refills | Status: AC

## 2021-12-27 NOTE — ED Triage Notes (Signed)
Pt c/o urinary frequency onset for four days

## 2021-12-27 NOTE — ED Provider Notes (Signed)
[name]    CSN: [id number] Arrival date & time: 12/27/21  1551      History   Chief Complaint Chief Complaint  Patient presents with   Urinary Frequency    HPI Christine Logan is a 71 y.o. female.   HPI 71 year old female presents with dysuria for 4 days.  PMH significant for tachycardia.  Past Medical History:  Diagnosis Date   Tachycardia     There are no problems to display for this patient.   Past Surgical History:  Procedure Laterality Date   ABDOMINAL HYSTERECTOMY     cardioversions     TONSILLECTOMY     TUBAL LIGATION      OB History   No obstetric history on file.      Home Medications    Prior to Admission medications   Medication Sig Start Date End Date Taking? Authorizing Provider  aspirin 81 MG chewable tablet Chew by mouth daily.    [provider]  atorvastatin (LIPITOR) 20 MG tablet Take 20 mg by mouth daily.    [provider]  azithromycin (ZITHROMAX Z-PAK) 250 MG tablet Take two pills today followed by one a day until gone 06/25/21   Raylene Everts, MD  benzonatate (TESSALON) 200 MG capsule Take 1 capsule (200 mg total) by mouth 2 (two) times daily as needed for cough. 06/25/21   Raylene Everts, MD  famotidine (PEPCID) 20 MG tablet Take 20 mg by mouth 2 (two) times daily. 10/09/20   [provider]  fluticasone (FLONASE) 50 MCG/ACT nasal spray Place 2 sprays into both nostrils daily. 05/28/20   Faustino Congress, NP  metoprolol succinate (TOPROL-XL) 100 MG 24 hr tablet Take 100 mg by mouth daily. Take with or immediately following a meal.    [provider]  pantoprazole (PROTONIX) 40 MG tablet Take 40 mg by mouth daily.    [provider]  sulfamethoxazole-trimethoprim (BACTRIM DS) 800-160 MG tablet Take 1 tablet by mouth 2 (two) times daily for 7 days. 12/27/21 01/03/22 Yes Eliezer Lofts, FNP  venlafaxine XR (EFFEXOR-XR) 37.5 MG 24 hr capsule Take 37.5 mg by mouth daily with  breakfast.    [provider]    Family History Family History  Problem Relation Age of Onset   Diabetes Mother    Hypertension Mother    Hyperlipidemia Mother    Diabetes Father    Hypertension Father    Hyperlipidemia Father    Cancer Father     Social History Social History   Tobacco Use   Smoking status: Former   Smokeless tobacco: Never  Substance Use Topics   Alcohol use: Yes    Comment: occ   Drug use: No     Allergies   Patient has no known allergies.   Review of Systems Review of Systems  Genitourinary:  Positive for frequency.     Physical Exam Triage Vital Signs ED Triage Vitals  Enc Vitals Group     BP      Pulse      Resp      Temp      Temp src      SpO2      Weight      Height      Head Circumference      Peak Flow      Pain Score      Pain Loc      Pain Edu?      Excl. in [place]?  No data found.  Updated Vital Signs BP (!) 161/85 (BP Location: Right Arm)   Temp (!) 97.5 F (36.4 C) (Oral)   Resp 18   SpO2 97%      Physical Exam Vitals and nursing note reviewed.  Constitutional:      Appearance: Normal appearance. She is normal weight.  HENT:     Head: Normocephalic and atraumatic.     Mouth/Throat:     Mouth: Mucous membranes are moist.     Pharynx: Oropharynx is clear.  Eyes:     Extraocular Movements: Extraocular movements intact.     Conjunctiva/sclera: Conjunctivae normal.     Pupils: Pupils are equal, round, and reactive to light.  Cardiovascular:     Rate and Rhythm: Normal rate and regular rhythm.     Pulses: Normal pulses.     Heart sounds: Normal heart sounds. No murmur heard. Pulmonary:     Effort: Pulmonary effort is normal.     Breath sounds: Normal breath sounds. No wheezing, rhonchi or rales.  Abdominal:     Tenderness: There is no right CVA tenderness or left CVA tenderness.  Musculoskeletal:        General: Normal range of motion.     Cervical back: Normal range of motion and neck  supple.  Skin:    General: Skin is warm and dry.  Neurological:     General: No focal deficit present.     Mental Status: She is alert and oriented to person, place, and time.      UC Treatments / Results  Labs (all labs ordered are listed, but only abnormal results are displayed) Labs Reviewed  POCT URINALYSIS DIP (MANUAL ENTRY) - Abnormal; Notable for the following components:      Result Value   Leukocytes, UA Moderate (2+) (*)    All other components within normal limits  URINE CULTURE    EKG   Radiology No results found.  Procedures Procedures (including critical care time)  Medications Ordered in UC Medications - No data to display  Initial Impression / Assessment and Plan / UC Course  I have reviewed the triage vital signs and the nursing notes.  Pertinent labs & imaging results that were available during my care of the patient were reviewed by me and considered in my medical decision making (see chart for details).     MDM: 1.  Urinary frequency-Rx'd Bactrim, urine culture ordered. Advised patient to take medication as directed with food to completion.  Encouraged increase daily water intake while taking this medication.  Advised patient we will follow-up with urine culture results once received.  Advised patient if symptoms worsen and/or unresolved please follow-up with PCP or here for further evaluation.  Patient discharged home, hemodynamically stable. Final Clinical Impressions(s) / UC Diagnoses   Final diagnoses:  Urinary frequency     Discharge Instructions      Advised patient to take medication as directed with food to completion.  Encouraged increase daily water intake while taking this medication.  Advised patient we will follow-up with urine culture results once received.  Advised patient if symptoms worsen and/or unresolved please follow-up with PCP or here for further evaluation.     ED Prescriptions     Medication Sig Dispense Auth.  Provider   sulfamethoxazole-trimethoprim (BACTRIM DS) 800-160 MG tablet Take 1 tablet by mouth 2 (two) times daily for 7 days. 14 tablet Christine Iha, FNP      PDMP not reviewed this encounter.   Christine Logan,  [place], FNP 12/27/21 1614

## 2021-12-27 NOTE — Discharge Instructions (Addendum)
Advised patient to take medication as directed with food to completion.  Encouraged increase daily water intake while taking this medication.  Advised patient we will follow-up with urine culture results once received.  Advised patient if symptoms worsen and/or unresolved please follow-up with PCP or here for further evaluation.

## 2021-12-28 ENCOUNTER — Telehealth: Payer: Self-pay

## 2021-12-28 NOTE — Telephone Encounter (Signed)
Called pt regarding recent urgent care visit. Pt reported feeling much better. Pt verbalized understanding of the discharge instructions that was given. No additional questions or concerns was reported at this time.

## 2021-12-30 LAB — URINE CULTURE: Culture: 10000 — AB

## 2022-09-17 ENCOUNTER — Ambulatory Visit
Admission: EM | Admit: 2022-09-17 | Discharge: 2022-09-17 | Disposition: A | Discharge status: Home / Self Care | Payer: Medicare PPO | Attending: Urgent Care | Admitting: Urgent Care

## 2022-09-17 ENCOUNTER — Other Ambulatory Visit: Payer: Self-pay

## 2022-09-17 DIAGNOSIS — N3090 Cystitis, unspecified without hematuria: Secondary | ICD-10-CM

## 2022-09-17 LAB — POCT URINALYSIS DIP (MANUAL ENTRY)
Bilirubin, UA: NEGATIVE
Glucose, UA: NEGATIVE mg/dL
Ketones, POC UA: NEGATIVE mg/dL
Nitrite, UA: NEGATIVE
Spec Grav, UA: >=1.03 — AB (ref 1.010–1.025)
Urobilinogen, UA: 0.2 E.U./dL
pH, UA: 6 (ref 5.0–8.0)

## 2022-09-17 MED ORDER — NITROFURANTOIN MONOHYD MACRO 100 MG PO CAPS: 100.0000 mg | ORAL_CAPSULE | Freq: Two times a day (BID) | ORAL | 0 refills | Status: AC

## 2022-09-17 NOTE — ED Triage Notes (Signed)
Pt c/o urinary frequency and some burning x 3 days.

## 2022-09-17 NOTE — Discharge Instructions (Signed)
Your symptoms are consistent with a urinary tract infection. Start taking the antibiotic twice daily until completed. You can take OTC cranberry caps or AZO for the discomfort. We will send out your urine culture and only call if a change in treatment is necessary. Monitor for any change in or worsening symptoms; fever, hematuria or flank pain would warrant a recheck

## 2022-09-17 NOTE — ED Provider Notes (Signed)
[facility]    CSN: [id number] Arrival date & time: 09/17/22  1827      History   Chief Complaint Chief Complaint  Patient presents with   Urinary Frequency   Dysuria    HPI Christine Logan is a 72 y.o. female.   Pleasant 72 year old female presents today due to concerns of urinary frequency, hesitancy, and dysuria.  She reports numerous UTIs in the past and states this feels similar.  She is uncertain what the cause is.  She drinks plenty of water.  States symptoms have been present for 3 to 4 days.  Last UTI was here in September.  Patient denies rash or lesions.  No pelvic pain or vaginal discharge.  Denies any hematuria, nausea or vomiting, flank pain.  No over-the-counter treatments tried.   Urinary Frequency  Dysuria   Past Medical History:  Diagnosis Date   Tachycardia     There are no problems to display for this patient.   Past Surgical History:  Procedure Laterality Date   ABDOMINAL HYSTERECTOMY     cardioversions     TONSILLECTOMY     TUBAL LIGATION      OB History   No obstetric history on file.      Home Medications    Prior to Admission medications   Medication Sig Start Date End Date Taking? Authorizing Provider  nitrofurantoin, macrocrystal-monohydrate, (MACROBID) 100 MG capsule Take 1 capsule (100 mg total) by mouth 2 (two) times daily for 7 days. 09/17/22 09/24/22 Yes Justus Duerr L, PA  aspirin 81 MG chewable tablet Chew by mouth daily.    [provider]  atorvastatin (LIPITOR) 20 MG tablet Take 20 mg by mouth daily.    [provider]  famotidine (PEPCID) 20 MG tablet Take 20 mg by mouth 2 (two) times daily. 10/09/20   [provider]  fluticasone (FLONASE) 50 MCG/ACT nasal spray Place 2 sprays into both nostrils daily. 05/28/20   Moshe Cipro, NP  metoprolol succinate (TOPROL-XL) 100 MG 24 hr tablet Take 100 mg by mouth daily. Take with or immediately following a meal.    [provider]   pantoprazole (PROTONIX) 40 MG tablet Take 40 mg by mouth daily.    [provider]  venlafaxine XR (EFFEXOR-XR) 37.5 MG 24 hr capsule Take 37.5 mg by mouth daily with breakfast.    [provider]    Family History Family History  Problem Relation Age of Onset   Diabetes Mother    Hypertension Mother    Hyperlipidemia Mother    Diabetes Father    Hypertension Father    Hyperlipidemia Father    Cancer Father     Social History Social History   Tobacco Use   Smoking status: Former   Smokeless tobacco: Never  Substance Use Topics   Alcohol use: Yes    Comment: occ   Drug use: No     Allergies   Patient has no known allergies.   Review of Systems Review of Systems  Genitourinary:  Positive for dysuria and frequency.  As per HPI   Physical Exam Triage Vital Signs ED Triage Vitals  Enc Vitals Group     BP 09/17/22 1840 (!) 166/77     Pulse Rate 09/17/22 1840 78     Resp 09/17/22 1840 17     Temp 09/17/22 1840 97.7 F (36.5 C)     Temp Source 09/17/22 1840 Oral     SpO2 09/17/22 1840 99 %  Weight --      Height --      Head Circumference --      Peak Flow --      Pain Score 09/17/22 1842 0     Pain Loc --      Pain Edu? --      Excl. in GC? --    No data found.  Updated Vital Signs BP (!) 166/77 (BP Location: Right Arm)   Pulse 78   Temp 97.7 F (36.5 C) (Oral)   Resp 17   SpO2 99%   Visual Acuity Right Eye Distance:   Left Eye Distance:   Bilateral Distance:    Right Eye Near:   Left Eye Near:    Bilateral Near:     Physical Exam Vitals and nursing note reviewed.  Constitutional:      General: She is not in acute distress.    Appearance: Normal appearance. She is normal weight. She is not ill-appearing, toxic-appearing or diaphoretic.  HENT:     Head: Normocephalic and atraumatic.  Eyes:     Pupils: Pupils are equal, round, and reactive to light.  Cardiovascular:     Rate and Rhythm: Normal rate.     Pulses:  Normal pulses.  Pulmonary:     Effort: Pulmonary effort is normal. No respiratory distress.  Abdominal:     General: Abdomen is flat. Bowel sounds are normal. There is no distension.     Palpations: Abdomen is soft. There is no mass.     Tenderness: There is no abdominal tenderness. There is no right CVA tenderness, left CVA tenderness, guarding or rebound.     Hernia: No hernia is present.  Musculoskeletal:     Right lower leg: No edema.     Left lower leg: No edema.  Skin:    General: Skin is warm.     Findings: No bruising, erythema or rash.  Neurological:     General: No focal deficit present.     Mental Status: She is alert. Mental status is at baseline.  Psychiatric:        Mood and Affect: Mood normal.      UC Treatments / Results  Labs (all labs ordered are listed, but only abnormal results are displayed) Labs Reviewed  POCT URINALYSIS DIP (MANUAL ENTRY) - Abnormal; Notable for the following components:      Result Value   Spec Grav, UA >=1.030 (*)    Blood, UA small (*)    Protein Ur, POC trace (*)    Leukocytes, UA Trace (*)    All other components within normal limits  URINE CULTURE    EKG   Radiology No results found.  Procedures Procedures (including critical care time)  Medications Ordered in UC Medications - No data to display  Initial Impression / Assessment and Plan / UC Course  I have reviewed the triage vital signs and the nursing notes.  Pertinent labs & imaging results that were available during my care of the patient were reviewed by me and considered in my medical decision making (see chart for details).     Cystitis -patient does have a history of benign appearing UA dips in office, but positive urine cultures.  Given her history of recurrent UTIs we will go ahead and start treatment with nitrofurantoin.  Will send out a urine culture to assess sensitivities.  We also discussed causes of recurrent UTI symptoms including bladder  retention, atrophic vaginitis, etc. Pt will follow up with  PCP or urology should sx persist/ recur.  Final Clinical Impressions(s) / UC Diagnoses   Final diagnoses:  Cystitis     Discharge Instructions      Your symptoms are consistent with a urinary tract infection. Start taking the antibiotic twice daily until completed. You can take OTC cranberry caps or AZO for the discomfort. We will send out your urine culture and only call if a change in treatment is necessary. Monitor for any change in or worsening symptoms; fever, hematuria or flank pain would warrant a recheck      ED Prescriptions     Medication Sig Dispense Auth. Provider   nitrofurantoin, macrocrystal-monohydrate, (MACROBID) 100 MG capsule Take 1 capsule (100 mg total) by mouth 2 (two) times daily for 7 days. 14 capsule Zaelyn Noack L, PA      PDMP not reviewed this encounter.   Maretta Bees, [place] 09/17/22 [phone]

## 2022-09-20 LAB — URINE CULTURE: Culture: 20000 — AB

## 2023-03-03 ENCOUNTER — Encounter: Payer: Self-pay | Admitting: Emergency Medicine

## 2023-03-03 ENCOUNTER — Ambulatory Visit
Admission: EM | Admit: 2023-03-03 | Discharge: 2023-03-03 | Disposition: A | Discharge status: Home / Self Care | Payer: Medicare PPO | Attending: Family Medicine | Admitting: Family Medicine

## 2023-03-03 DIAGNOSIS — N390 Urinary tract infection, site not specified: Secondary | ICD-10-CM | POA: Diagnosis present

## 2023-03-03 DIAGNOSIS — N3001 Acute cystitis with hematuria: Secondary | ICD-10-CM | POA: Diagnosis present

## 2023-03-03 LAB — POCT URINALYSIS DIP (MANUAL ENTRY)
Bilirubin, UA: NEGATIVE
Glucose, UA: NEGATIVE mg/dL
Ketones, POC UA: NEGATIVE mg/dL
Leukocytes, UA: NEGATIVE
Nitrite, UA: NEGATIVE
Protein Ur, POC: 100 mg/dL — AB
Spec Grav, UA: >=1.03 — AB (ref 1.010–1.025)
Urobilinogen, UA: 0.2 U/dL
pH, UA: 5.5 (ref 5.0–8.0)

## 2023-03-03 MED ORDER — NITROFURANTOIN MONOHYD MACRO 100 MG PO CAPS: 100.0000 mg | ORAL_CAPSULE | Freq: Two times a day (BID) | ORAL | 0 refills | Status: DC

## 2023-03-03 NOTE — ED Triage Notes (Signed)
Patient c/o hematuria, urinary frequency and urgency x 3 days.  Patient denies any OTC meds.

## 2023-03-03 NOTE — Discharge Instructions (Signed)
Continue to drink lots of water Take the antibiotic 2 times a day as directed See your primary care doctor to discuss recurring urinary infections  Your urine has been sent for culture.  You will be called if any change in antibiotic is necessary

## 2023-03-03 NOTE — ED Provider Notes (Signed)
[facility]    CSN: [id number] Arrival date & time: 03/03/23  1802      History   Chief Complaint Chief Complaint  Patient presents with   Hematuria    HPI Christine Logan is a 72 y.o. female.   Patient has recurring urinary tract infections.  She has had several this year.  Currently has dysuria hematuria and frequency.  Her urinalysis is normal.  She has no abdominal pain, flank pain, fever or chills.  No nausea or vomiting.  Has not had a history of any kidney problems or kidney infections.    Past Medical History:  Diagnosis Date   Tachycardia     There are no problems to display for this patient.   Past Surgical History:  Procedure Laterality Date   ABDOMINAL HYSTERECTOMY     cardioversions     TONSILLECTOMY     TUBAL LIGATION      OB History   No obstetric history on file.      Home Medications    Prior to Admission medications   Medication Sig Start Date End Date Taking? Authorizing Provider  aspirin 81 MG chewable tablet Chew by mouth daily.   Yes [provider]  atorvastatin (LIPITOR) 20 MG tablet Take 20 mg by mouth daily.   Yes [provider]  famotidine (PEPCID) 20 MG tablet Take 20 mg by mouth 2 (two) times daily. 10/09/20  Yes [provider]  fluticasone (FLONASE) 50 MCG/ACT nasal spray Place 2 sprays into both nostrils daily. 05/28/20  Yes Moshe Cipro, FNP  metoprolol succinate (TOPROL-XL) 100 MG 24 hr tablet Take 100 mg by mouth daily. Take with or immediately following a meal.   Yes [provider]  nitrofurantoin, macrocrystal-monohydrate, (MACROBID) 100 MG capsule Take 1 capsule (100 mg total) by mouth 2 (two) times daily. 03/03/23  Yes Eustace Moore, MD  pantoprazole (PROTONIX) 40 MG tablet Take 40 mg by mouth daily.   Yes [provider]  venlafaxine XR (EFFEXOR-XR) 37.5 MG 24 hr capsule Take 37.5 mg by mouth daily with breakfast.   Yes [provider]     Family History Family History  Problem Relation Age of Onset   Diabetes Mother    Hypertension Mother    Hyperlipidemia Mother    Diabetes Father    Hypertension Father    Hyperlipidemia Father    Cancer Father     Social History Social History   Tobacco Use   Smoking status: Former   Smokeless tobacco: Never  Advertising account planner   Vaping status: Never Used  Substance Use Topics   Alcohol use: Yes    Comment: occ   Drug use: No     Allergies   Patient has no known allergies.   Review of Systems Review of Systems See HPI  Physical Exam Triage Vital Signs ED Triage Vitals  Encounter Vitals Group     BP 03/03/23 1814 (!) 180/79     Systolic BP Percentile --      Diastolic BP Percentile --      Pulse Rate 03/03/23 1814 65     Resp 03/03/23 1814 18     Temp 03/03/23 1814 97.9 F (36.6 C)     Temp Source 03/03/23 1814 Oral     SpO2 03/03/23 1814 98 %     Weight 03/03/23 1815 172 lb (78 kg)     Height 03/03/23 1815 5\' 2"  (1.575 m)     Head Circumference --  Peak Flow --      Pain Score 03/03/23 1815 1     Pain Loc --      Pain Education --      Exclude from Growth Chart --    No data found.  Updated Vital Signs BP (!) 180/79 (BP Location: Right Arm)   Pulse 65   Temp 97.9 F (36.6 C) (Oral)   Resp 18   Ht 5\' 2"  (1.575 m)   Wt 78 kg   SpO2 98%   BMI 31.46 kg/m      Physical Exam Constitutional:      General: She is not in acute distress.    Appearance: She is well-developed and normal weight.  HENT:     Head: Normocephalic and atraumatic.  Eyes:     Conjunctiva/sclera: Conjunctivae normal.     Pupils: Pupils are equal, round, and reactive to light.  Cardiovascular:     Rate and Rhythm: Normal rate.  Pulmonary:     Effort: Pulmonary effort is normal. No respiratory distress.  Abdominal:     General: There is no distension.     Palpations: Abdomen is soft.     Tenderness: There is no right CVA tenderness or left CVA tenderness.   Musculoskeletal:        General: Normal range of motion.     Cervical back: Normal range of motion.  Skin:    General: Skin is warm and dry.  Neurological:     Mental Status: She is alert.      UC Treatments / Results  Labs (all labs ordered are listed, but only abnormal results are displayed) Labs Reviewed  POCT URINALYSIS DIP (MANUAL ENTRY) - Abnormal; Notable for the following components:      Result Value   Color, UA other (*)    Clarity, UA turbid (*)    Spec Grav, UA >=1.030 (*)    Blood, UA large (*)    Protein Ur, POC =100 (*)    All other components within normal limits  URINE CULTURE    EKG   Radiology No results found.  Procedures Procedures (including critical care time)  Medications Ordered in UC Medications - No data to display  Initial Impression / Assessment and Plan / UC Course  I have reviewed the triage vital signs and the nursing notes.  Pertinent labs & imaging results that were available during my care of the patient were reviewed by me and considered in my medical decision making (see chart for details).     Discussed fluids, medical treatment.  Importance of following up with PCP discussed recurring UTI is emphasized Final Clinical Impressions(s) / UC Diagnoses   Final diagnoses:  Recurrent urinary tract infection  Acute cystitis with hematuria     Discharge Instructions      Continue to drink lots of water Take the antibiotic 2 times a day as directed See your primary care doctor to discuss recurring urinary infections  Your urine has been sent for culture.  You will be called if any change in antibiotic is necessary   ED Prescriptions     Medication Sig Dispense Auth. Provider   nitrofurantoin, macrocrystal-monohydrate, (MACROBID) 100 MG capsule Take 1 capsule (100 mg total) by mouth 2 (two) times daily. 14 capsule Eustace Moore, MD      PDMP not reviewed this encounter.   Eustace Moore, MD 03/03/23  [phone]

## 2023-03-05 LAB — URINE CULTURE
Culture: NO GROWTH
Special Requests: NORMAL

## 2023-12-17 IMAGING — DX DG CHEST 2V
2 series · 2 of 2 positions shown · non-contrast
Comparison: Chest x-ray 08/13/2016.

CLINICAL DATA: Cough for 3 weeks.

EXAM:
CHEST - 2 VIEW

[chest pa]
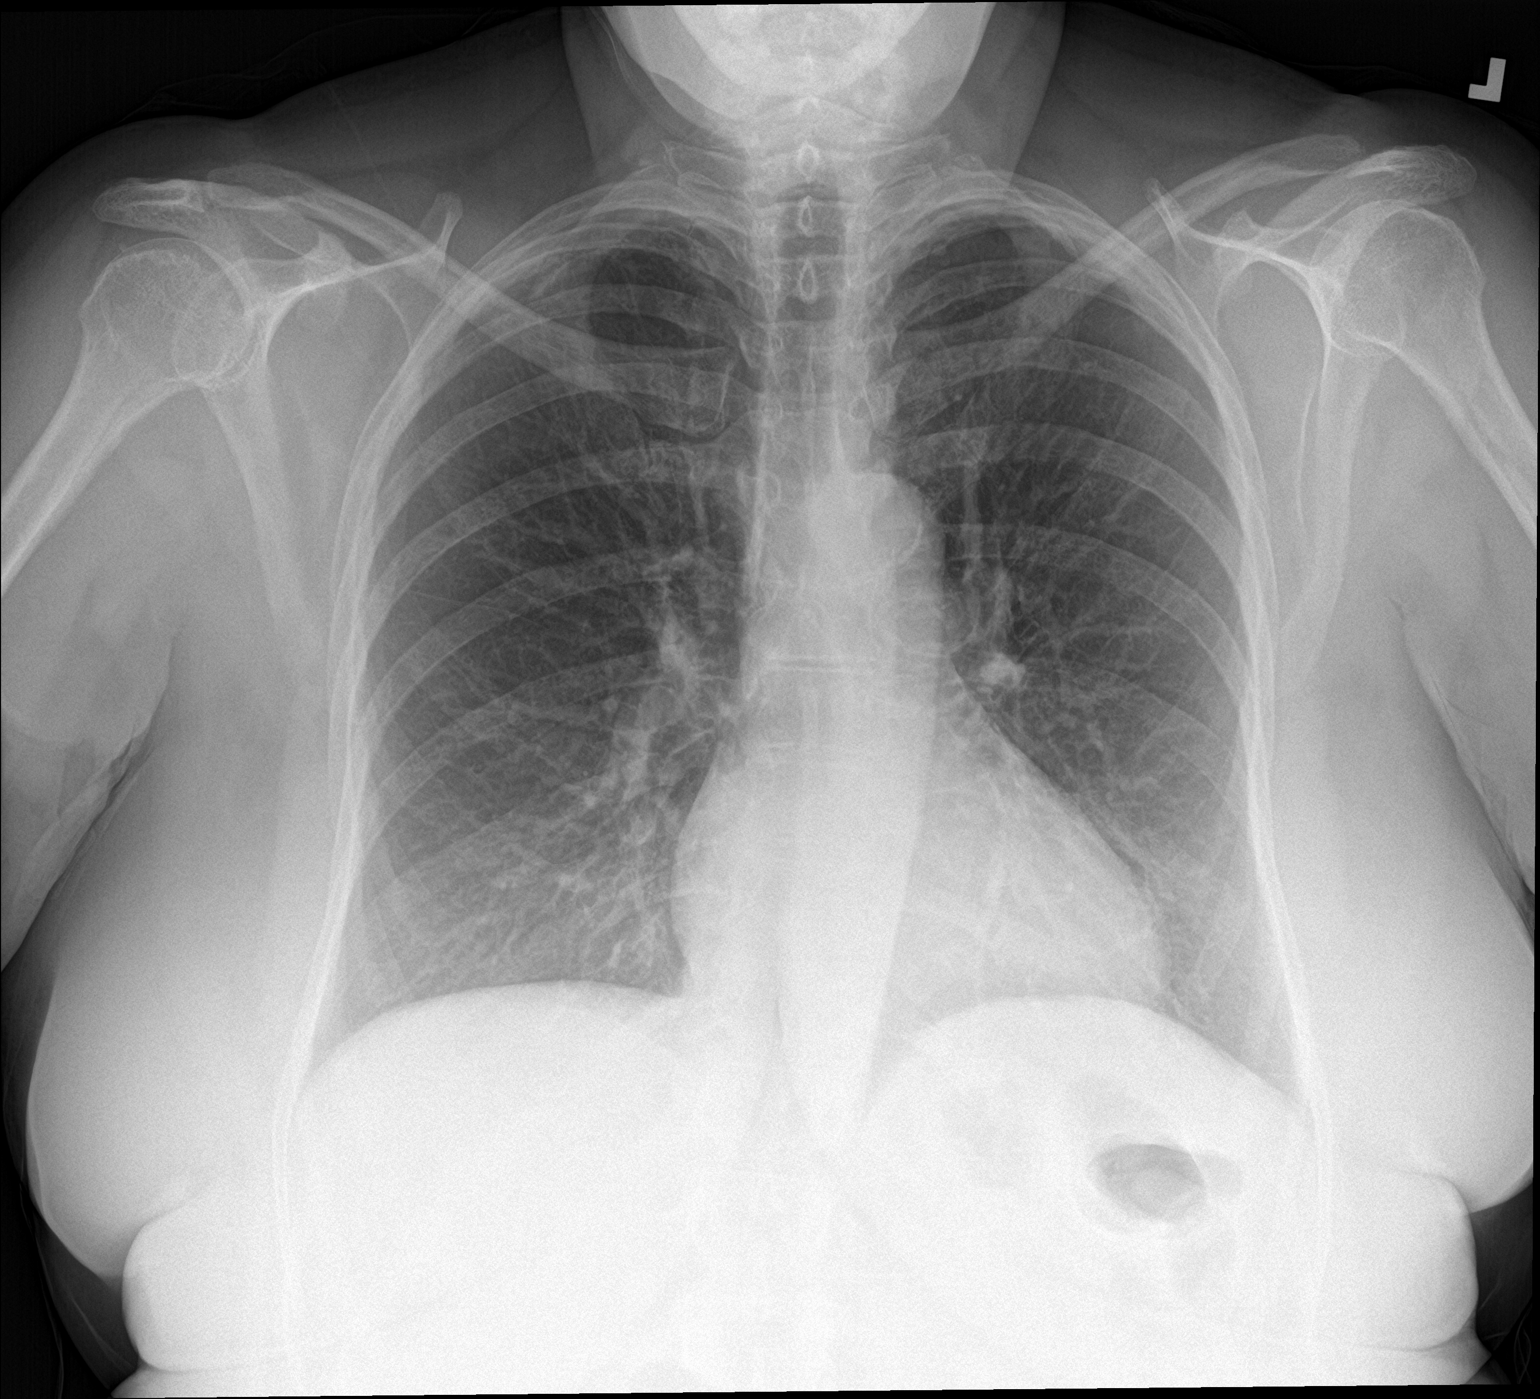

[chest lat]
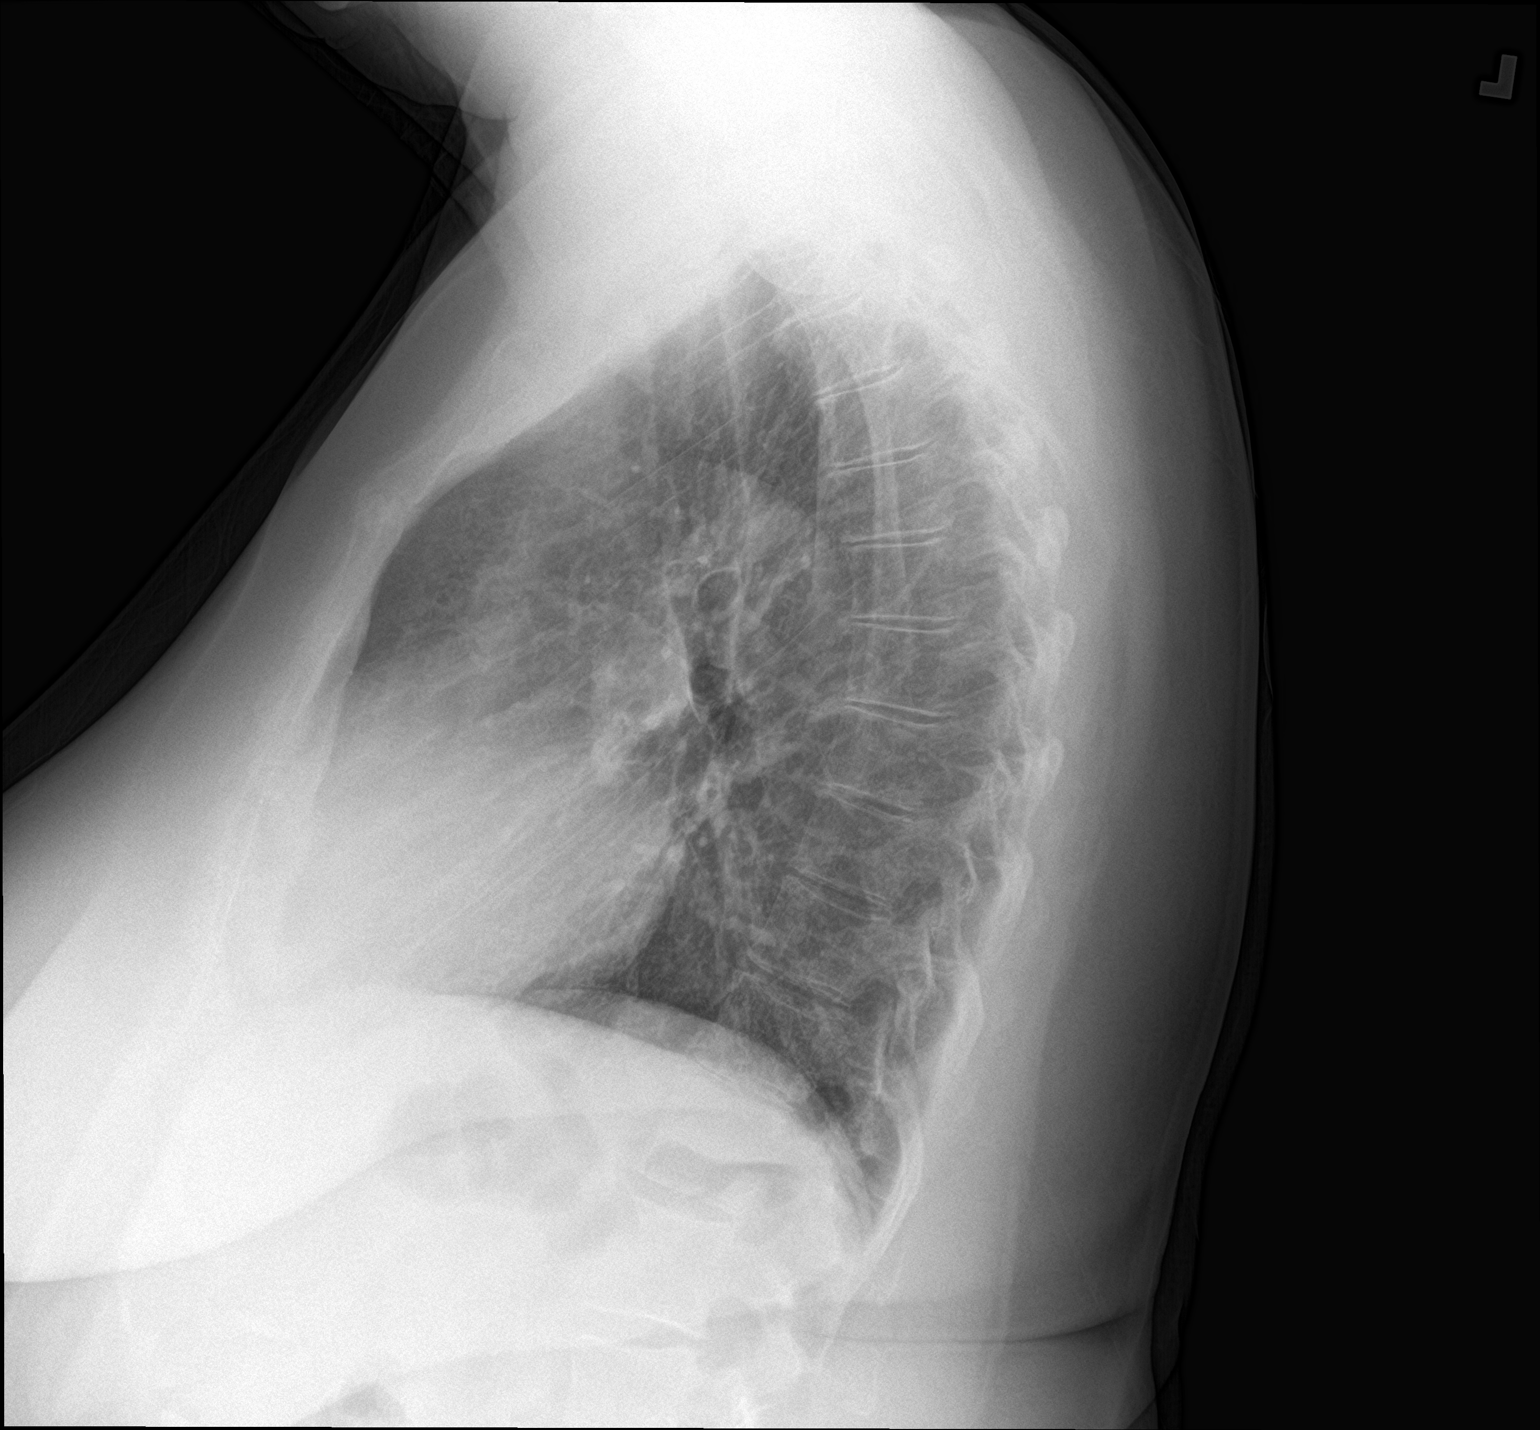

[2 of 2 positions shown; findings below may reference images not displayed]

FINDINGS: The heart size and mediastinal contours are within normal limits.
Both lungs are clear. The visualized skeletal structures are
unremarkable.
IMPRESSION: No active cardiopulmonary disease.

## 2023-12-22 ENCOUNTER — Other Ambulatory Visit: Payer: Self-pay

## 2023-12-22 ENCOUNTER — Ambulatory Visit
Admission: RE | Admit: 2023-12-22 | Discharge: 2023-12-22 | Disposition: A | Discharge status: Home / Self Care | Attending: Family Medicine | Admitting: Family Medicine

## 2023-12-22 VITALS — BP 197/98 | HR 84 | Temp 97.7°F | Resp 16

## 2023-12-22 DIAGNOSIS — U071 COVID-19: Secondary | ICD-10-CM

## 2023-12-22 LAB — POC SARS CORONAVIRUS 2 AG -  ED: SARS Coronavirus 2 Ag: POSITIVE — AB

## 2023-12-22 MED ORDER — PAXLOVID (300/100) 20 X 150 MG & 10 X 100MG PO TBPK: 3.0000 | ORAL_TABLET | Freq: Two times a day (BID) | ORAL | 0 refills | Status: AC

## 2023-12-22 NOTE — ED Triage Notes (Signed)
 Has c/o cough, body aches, congestion x 3 - 4 days. Has not noticed fever. Has had sinus/allergy medication, mucinex, tylenol.

## 2023-12-22 NOTE — ED Provider Notes (Signed)
 [facility]    CSN: [id number] Arrival date & time: 12/22/23  1837      History   Chief Complaint Chief Complaint  Patient presents with   Cough    HPI Christine Logan is a 73 y.o. female.   HPI  Pleasant 73 year old woman.  Known to me from prior visit.  Generally in good health.  On medication for hypertension and hyperlipidemia.  Is here today with cough body aches head and chest congestion for the last 3 to 4 days.  Has not noticed a fever.  SHe is feeling tired.  She is taking some over-the-counter cough and cold medicine.  Mucinex.  Here for persistent symptoms. Past Medical History:  Diagnosis Date   Tachycardia     There are no active problems to display for this patient.   Past Surgical History:  Procedure Laterality Date   ABDOMINAL HYSTERECTOMY     cardioversions     TONSILLECTOMY     TUBAL LIGATION      OB History   No obstetric history on file.      Home Medications    Prior to Admission medications   Medication Sig Start Date End Date Taking? Authorizing Provider  nirmatrelvir/ritonavir (PAXLOVID , 300/100,) 20 x 150 MG & 10 x 100MG  TBPK Take 3 tablets by mouth 2 (two) times daily for 5 days. Patient GFR is 82.   Take nirmatrelvir (150 mg) two tablets twice daily for 5 days and ritonavir (100 mg) one tablet twice daily for 5 days. 12/22/23 12/27/23 Yes Maranda Jamee Jacob, MD  aspirin 81 MG chewable tablet Chew by mouth daily.    [provider]  atorvastatin (LIPITOR) 20 MG tablet Take 20 mg by mouth daily.    [provider]  famotidine (PEPCID) 20 MG tablet Take 20 mg by mouth 2 (two) times daily. 10/09/20   [provider]  fluticasone  (FLONASE ) 50 MCG/ACT nasal spray Place 2 sprays into both nostrils daily. 05/28/20   Alvia Corean CROME, FNP  metoprolol succinate (TOPROL-XL) 100 MG 24 hr tablet Take 100 mg by mouth daily. Take with or immediately following a meal.    [provider]  pantoprazole  (PROTONIX) 40 MG tablet Take 40 mg by mouth daily.    [provider]  venlafaxine XR (EFFEXOR-XR) 37.5 MG 24 hr capsule Take 37.5 mg by mouth daily with breakfast.    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   Review of Systems Review of Systems See HPI  Physical Exam Triage Vital Signs ED Triage Vitals  Encounter Vitals Group     BP 12/22/23 1843 (!) 197/98     Girls Systolic BP Percentile --      Girls Diastolic BP Percentile --      Boys Systolic BP Percentile --      Boys Diastolic BP Percentile --      Pulse Rate 12/22/23 1843  84     Resp 12/22/23 1843 16     Temp 12/22/23 1843 97.7 F (36.5 C)     Temp Source 12/22/23 1843 Oral     SpO2 12/22/23 1843 94 %     Weight --      Height --      Head Circumference --      Peak Flow --      Pain Score 12/22/23 1846 4     Pain Loc --      Pain Education --      Exclude from Growth Chart --    No data found.  Updated Vital Signs BP (!) 197/98   Pulse 84   Temp 97.7 F (36.5 C) (Oral)   Resp 16   SpO2 94%    Physical Exam Constitutional:      General: She is not in acute distress.    Appearance: She is well-developed. She is ill-appearing.  HENT:     Head: Normocephalic and atraumatic.  Eyes:     Conjunctiva/sclera: Conjunctivae normal.     Pupils: Pupils are equal, round, and reactive to light.  Cardiovascular:     Rate and Rhythm: Normal rate.  Pulmonary:     Effort: Pulmonary effort is normal. No respiratory distress.   Musculoskeletal:        General: Normal range of motion.     Cervical back: Normal range of motion.  Skin:    General: Skin is warm and dry.  Neurological:     Mental Status: She is alert.      UC Treatments / Results  Labs (all labs ordered are listed, but only abnormal results are displayed) Labs Reviewed  POC SARS CORONAVIRUS 2 AG -  ED - Abnormal; Notable for the following components:      Result Value   SARS Coronavirus 2 Ag Positive (*)    All other components within normal limits    EKG   Radiology No results found.  Procedures Procedures (including critical care time)  Medications Ordered in UC Medications - No data to display  Initial Impression / Assessment and Plan / UC Course  I have reviewed the triage vital signs and the nursing notes.  Pertinent labs & imaging results that were available during my care of the patient were reviewed by me and considered in my medical decision making (see chart for details).     Discussed COVID-positive.  Prevention of spread.  Home care Final Clinical Impressions(s) / UC Diagnoses   Final diagnoses:  COVID-19     Discharge Instructions      Take the Paxlovid  2 times a day for 5 days Make sure you are drinking lots of water May take Tylenol or ibuprofen as needed for pain and fever May take over-the-counter cough and cold medicines if needed Call for problems     ED Prescriptions     Medication Sig Dispense Auth. Provider   nirmatrelvir/ritonavir (PAXLOVID , 300/100,) 20 x 150 MG & 10 x 100MG  TBPK Take 3 tablets by mouth 2 (two) times daily for 5 days. Patient GFR is 82.   Take nirmatrelvir (150 mg) two tablets twice daily for 5 days and ritonavir (100 mg) one tablet twice daily for 5 days. 30 tablet Maranda Jamee Jacob, MD      PDMP not reviewed this encounter.   Maranda Jamee Jacob, MD 12/23/23 [phone]

## 2023-12-22 NOTE — Discharge Instructions (Signed)
 Take the Paxlovid  2 times a day for 5 days Make sure you are drinking lots of water May take Tylenol or ibuprofen as needed for pain and fever May take over-the-counter cough and cold medicines if needed Call for problems

## 2023-12-23 ENCOUNTER — Telehealth: Payer: Self-pay

## 2023-12-23 NOTE — Telephone Encounter (Signed)
Called to check on patient. Left voicemail.
